# Patient Record
Sex: Female | Born: 1980 | Race: White | Hispanic: No | Marital: Married | Smoking: Never smoker
Health system: Southern US, Community
[De-identification: ages and names within clinical notes are randomized; demographics above are authoritative.]

## PROBLEM LIST (undated history)

## (undated) DIAGNOSIS — G40909 Epilepsy, unspecified, not intractable, without status epilepticus: Secondary | ICD-10-CM

## (undated) DIAGNOSIS — U071 COVID-19: Secondary | ICD-10-CM

## (undated) HISTORY — PX: SHOULDER ARTHROSCOPY: SHX128

---

## 2017-04-03 ENCOUNTER — Emergency Department
Admission: EM | Admit: 2017-04-03 | Discharge: 2017-04-03 | Disposition: A | Discharge status: Home / Self Care | Payer: BLUE CROSS/BLUE SHIELD | Source: Home / Self Care | Attending: Family Medicine | Admitting: Family Medicine

## 2017-04-03 DIAGNOSIS — R69 Illness, unspecified: Secondary | ICD-10-CM

## 2017-04-03 DIAGNOSIS — J111 Influenza due to unidentified influenza virus with other respiratory manifestations: Secondary | ICD-10-CM

## 2017-04-03 MED ORDER — OSELTAMIVIR PHOSPHATE 75 MG PO CAPS: 75.0000 mg | ORAL_CAPSULE | Freq: Two times a day (BID) | ORAL | 0 refills | Status: DC

## 2017-04-03 NOTE — Discharge Instructions (Signed)
Take plain guaifenesin (1200mg  extended release tabs such as Mucinex) twice daily, with plenty of water, for cough and congestion.  May add Pseudoephedrine (30mg , one or two every 4 to 6 hours) for sinus congestion.  Get adequate rest.   May use Afrin nasal spray (or generic oxymetazoline) each morning for about 5 days and then discontinue.  Also recommend using saline nasal spray several times daily and saline nasal irrigation (AYR is a common brand).  Use Flonase nasal spray each morning after using Afrin nasal spray and saline nasal irrigation. Try warm salt water gargles for sore throat.  May take Delsym Cough Suppressant at bedtime for nighttime cough.  Stop all antihistamines for now, and other non-prescription cough/cold preparations. May take Ibuprofen 200mg , 4 tabs every 8 hours with food for body aches, fever, etc.

## 2017-04-03 NOTE — ED Provider Notes (Signed)
[facility]    CSN: [id number] Arrival date & time: 04/03/17  1703     History   Chief Complaint Chief Complaint  Patient presents with  . Cough    HPI Primus Bravoikki Sweet is a 37 y.o. female.   Complains of 2 day history flu-like illness including myalgias, headache, chills/sweats, fatigue, and cough.  Also has mild nasal congestion and sore throat.  Cough is non-productive and somewhat worse at night.  No pleuritic pain or shortness of breath.      The history is provided by the patient.    No past medical history on file.  There are no active problems to display for this patient.     OB History    No data available       Home Medications    Prior to Admission medications   Medication Sig Start Date End Date Taking? Authorizing Provider  divalproex (DEPAKOTE) 500 MG DR tablet Take 1,000 mg by mouth daily.   Yes [provider]  oseltamivir (TAMIFLU) 75 MG capsule Take 1 capsule (75 mg total) by mouth every 12 (twelve) hours. 04/03/17   Lattie HawBeese, Zyonna Vardaman A, MD    Family History No family history on file.  Social History Social History   Tobacco Use  . Smoking status: Not on file  Substance Use Topics  . Alcohol use: Not on file  . Drug use: Not on file     Allergies   Septra [sulfamethoxazole-trimethoprim]   Review of Systems Review of Systems + sore throat + cough No pleuritic pain No wheezing + nasal congestion + post-nasal drainage + sinus pain/pressure No itchy/red eyes No earache No hemoptysis No SOB No fever, + chills/sweats No nausea No vomiting No abdominal pain No diarrhea No urinary symptoms No skin rash + fatigue + myalgias + headache Used OTC meds without relief   Physical Exam Triage Vital Signs ED Triage Vitals  Enc Vitals Group     BP 04/03/17 1724 131/84     Pulse Rate 04/03/17 1724 84     Resp 04/03/17 1724 16     Temp 04/03/17 1724 98.1 F (36.7 C)     Temp Source 04/03/17 1724 Oral     SpO2  04/03/17 1724 98 %     Weight 04/03/17 1725 129 lb 8 oz (58.7 kg)     Height 04/03/17 1725 5\' 1"  (1.549 m)     Head Circumference --      Peak Flow --      Pain Score 04/03/17 1725 2     Pain Loc --      Pain Edu? --      Excl. in GC? --    No data found.  Updated Vital Signs BP 131/84 (BP Location: Right Arm)   Pulse 84   Temp 98.1 F (36.7 C) (Oral)   Resp 16   Ht 5\' 1"  (1.549 m)   Wt 129 lb 8 oz (58.7 kg)   SpO2 98%   BMI 24.47 kg/m   Visual Acuity Right Eye Distance:   Left Eye Distance:   Bilateral Distance:    Right Eye Near:   Left Eye Near:    Bilateral Near:     Physical Exam Nursing notes and Vital Signs reviewed. Appearance:  Patient appears stated age, and in no acute distress Eyes:  Pupils are equal, round, and reactive to light and accomodation.  Extraocular movement is intact.  Conjunctivae are not inflamed  Ears:  Canals normal.  Tympanic membranes normal.  Nose:  Mildly congested turbinates.  No sinus tenderness.  Pharynx:  Normal Neck:  Supple.  Enlarged posterior/lateral nodes are palpated bilaterally, tender to palpation on the left.   Lungs:  Clear to auscultation.  Breath sounds are equal.  Moving air well. Heart:  Regular rate and rhythm without murmurs, rubs, or gallops.  Abdomen:  Nontender without masses or hepatosplenomegaly.  Bowel sounds are present.  No CVA or flank tenderness.  Extremities:  No edema.  Skin:  No rash present.    UC Treatments / Results  Labs (all labs ordered are listed, but only abnormal results are displayed) Labs Reviewed - No data to display  EKG  EKG Interpretation None       Radiology No results found.  Procedures Procedures (including critical care time)  Medications Ordered in UC Medications - No data to display   Initial Impression / Assessment and Plan / UC Course  I have reviewed the triage vital signs and the nursing notes.  Pertinent labs & imaging results that were available during my  care of the patient were reviewed by me and considered in my medical decision making (see chart for details).    Begin empiric Tamiflu. Take plain guaifenesin (1200mg  extended release tabs such as Mucinex) twice daily, with plenty of water, for cough and congestion.  May add Pseudoephedrine (30mg , one or two every 4 to 6 hours) for sinus congestion.  Get adequate rest.   May use Afrin nasal spray (or generic oxymetazoline) each morning for about 5 days and then discontinue.  Also recommend using saline nasal spray several times daily and saline nasal irrigation (AYR is a common brand).  Use Flonase nasal spray each morning after using Afrin nasal spray and saline nasal irrigation. Try warm salt water gargles for sore throat.  May take Delsym Cough Suppressant at bedtime for nighttime cough.  Stop all antihistamines for now, and other non-prescription cough/cold preparations. May take Ibuprofen 200mg , 4 tabs every 8 hours with food for body aches, fever, etc. Followup with Family Doctor if not improved in about 5 days. Will provide prophylactic Tamiflu Rx for two assymptomatic children at home.    Final Clinical Impressions(s) / UC Diagnoses   Final diagnoses:  Influenza-like illness    ED Discharge Orders        Ordered    oseltamivir (TAMIFLU) 75 MG capsule  Every 12 hours     04/03/17 1818          Lattie Haw, MD 04/07/17 1021

## 2017-04-03 NOTE — ED Triage Notes (Signed)
Pt. C/o chills, HA, sinus pain and cough for 2 days.

## 2018-01-11 DIAGNOSIS — G40909 Epilepsy, unspecified, not intractable, without status epilepticus: Secondary | ICD-10-CM | POA: Insufficient documentation

## 2018-10-17 ENCOUNTER — Emergency Department (INDEPENDENT_AMBULATORY_CARE_PROVIDER_SITE_OTHER)
Admission: EM | Admit: 2018-10-17 | Discharge: 2018-10-17 | Disposition: A | Discharge status: Home / Self Care | Payer: Managed Care, Other (non HMO) | Source: Home / Self Care | Attending: Family Medicine | Admitting: Family Medicine

## 2018-10-17 ENCOUNTER — Other Ambulatory Visit: Payer: Self-pay

## 2018-10-17 DIAGNOSIS — R519 Headache, unspecified: Secondary | ICD-10-CM

## 2018-10-17 DIAGNOSIS — R5383 Other fatigue: Secondary | ICD-10-CM

## 2018-10-17 DIAGNOSIS — R05 Cough: Secondary | ICD-10-CM

## 2018-10-17 DIAGNOSIS — R058 Other specified cough: Secondary | ICD-10-CM

## 2018-10-17 DIAGNOSIS — Z20818 Contact with and (suspected) exposure to other bacterial communicable diseases: Secondary | ICD-10-CM

## 2018-10-17 DIAGNOSIS — J029 Acute pharyngitis, unspecified: Secondary | ICD-10-CM | POA: Diagnosis not present

## 2018-10-17 HISTORY — DX: Epilepsy, unspecified, not intractable, without status epilepticus: G40.909

## 2018-10-17 LAB — POCT RAPID STREP A (OFFICE): Rapid Strep A Screen: NEGATIVE

## 2018-10-17 NOTE — ED Triage Notes (Addendum)
Sore throat since Sunday.  Both son and daughter have been dx with strep this week.

## 2018-10-17 NOTE — Discharge Instructions (Signed)
  You may take 500mg acetaminophen every 4-6 hours or in combination with ibuprofen 400-600mg every 6-8 hours as needed for pain, inflammation, and fever.  Be sure to well hydrated with clear liquids and get at least 8 hours of sleep at night, preferably more while sick.   Please follow up with family medicine in 1 week if needed.   

## 2018-10-17 NOTE — ED Provider Notes (Signed)
[facility]    CSN: [id number] Arrival date & time: 10/17/18  0848     History   Chief Complaint Chief Complaint  Patient presents with  . Sore Throat    HPI Lori Barry is a 38 y.o. female.   HPI Lori Barry is a 38 y.o. female presenting to UC with c/o 2 days of worsening sore throat, generalized HA, mild dry cough, and fatigue.  She has not taken anything for her symptoms. She reports hx of epilepsy and states she is hesitant to take any medications.  She does note her son and daughter were both dx with strep throat yesterday.  They have been attending a day camp. No known sick contacts. No known cases of Covid-19 at the camp. Pt works from home.    Past Medical History:  Diagnosis Date  . Epilepsy ([facility])     There are no active problems to display for this patient.   Past Surgical History:  Procedure Laterality Date  . SHOULDER ARTHROSCOPY      OB History   No obstetric history on file.      Home Medications    Prior to Admission medications   Medication Sig Start Date End Date Taking? Authorizing Provider  divalproex (DEPAKOTE) 500 MG DR tablet Take 1,000 mg by mouth daily.    [provider]  oseltamivir (TAMIFLU) 75 MG capsule Take 1 capsule (75 mg total) by mouth every 12 (twelve) hours. 04/03/17   Lattie HawBeese, Stephen A, MD    Family History History reviewed. No pertinent family history.  Social History Social History   Tobacco Use  . Smoking status: Never Smoker  . Smokeless tobacco: Never Used  Substance Use Topics  . Alcohol use: Not Currently  . Drug use: Not Currently     Allergies   Septra [sulfamethoxazole-trimethoprim]   Review of Systems Review of Systems  Constitutional: Positive for fatigue. Negative for chills and fever.  HENT: Positive for sore throat. Negative for congestion, ear pain, trouble swallowing and voice change.   Respiratory: Positive for cough. Negative for shortness of breath.   Cardiovascular:  Negative for chest pain and palpitations.  Gastrointestinal: Negative for abdominal pain, diarrhea, nausea and vomiting.  Musculoskeletal: Negative for arthralgias, back pain and myalgias.  Skin: Negative for rash.  Neurological: Positive for headaches. Negative for dizziness and light-headedness.     Physical Exam Triage Vital Signs ED Triage Vitals  Enc Vitals Group     BP 10/17/18 0908 116/84     Pulse Rate 10/17/18 0908 95     Resp 10/17/18 0908 20     Temp 10/17/18 0908 98.4 F (36.9 C)     Temp Source 10/17/18 0908 Oral     SpO2 10/17/18 0908 98 %     Weight 10/17/18 0910 145 lb (65.8 kg)     Height 10/17/18 0910 5\' 1"  (1.549 m)     Head Circumference --      Peak Flow --      Pain Score 10/17/18 0910 5     Pain Loc --      Pain Edu? --      Excl. in GC? --    No data found.  Updated Vital Signs BP 116/84 (BP Location: Right Arm)   Pulse 95   Temp 98.4 F (36.9 C) (Oral)   Resp 20   Ht 5\' 1"  (1.549 m)   Wt 145 lb (65.8 kg)   LMP 10/11/2018   SpO2 98%  BMI 27.40 kg/m   Visual Acuity Right Eye Distance:   Left Eye Distance:   Bilateral Distance:    Right Eye Near:   Left Eye Near:    Bilateral Near:     Physical Exam Vitals signs and nursing note reviewed.  Constitutional:      Appearance: She is well-developed.  HENT:     Head: Normocephalic and atraumatic.     Right Ear: Tympanic membrane normal.     Left Ear: Tympanic membrane normal.     Nose: Nose normal.     Right Sinus: No maxillary sinus tenderness or frontal sinus tenderness.     Left Sinus: No maxillary sinus tenderness or frontal sinus tenderness.     Mouth/Throat:     Lips: Pink.     Mouth: Mucous membranes are moist.     Pharynx: Oropharynx is clear. Uvula midline.  Neck:     Musculoskeletal: Normal range of motion and neck supple.  Cardiovascular:     Rate and Rhythm: Normal rate and regular rhythm.  Pulmonary:     Effort: Pulmonary effort is normal. No respiratory distress.      Breath sounds: Normal breath sounds. No stridor. No wheezing, rhonchi or rales.  Musculoskeletal: Normal range of motion.  Lymphadenopathy:     Cervical: No cervical adenopathy.  Skin:    General: Skin is warm and dry.  Neurological:     Mental Status: She is alert and oriented to person, place, and time.  Psychiatric:        Behavior: Behavior normal.      UC Treatments / Results  Labs (all labs ordered are listed, but only abnormal results are displayed) Labs Reviewed  STREP A DNA PROBE  NOVEL CORONAVIRUS, NAA  POCT RAPID STREP A (OFFICE)    EKG   Radiology No results found.  Procedures Procedures (including critical care time)  Medications Ordered in UC Medications - No data to display  Initial Impression / Assessment and Plan / UC Course  I have reviewed the triage vital signs and the nursing notes.  Pertinent labs & imaging results that were available during my care of the patient were reviewed by me and considered in my medical decision making (see chart for details).    Rapid strep: Negative Culture sent, will hold off on antibiotics given normal throat exam but culture sent due to known exposure to strep.  Discussed Covid-19 testing given pt's multiple symptoms. Pt agreeable. AVS provided.  Final Clinical Impressions(s) / UC Diagnoses   Final diagnoses:  Sore throat  Generalized headache  Fatigue, unspecified type  Dry cough  Exposure to strep throat     Discharge Instructions      You may take 500mg  acetaminophen every 4-6 hours or in combination with ibuprofen 400-600mg  every 6-8 hours as needed for pain, inflammation, and fever.  Be sure to well hydrated with clear liquids and get at least 8 hours of sleep at night, preferably more while sick.   Please follow up with family medicine in 1 week if needed.     ED Prescriptions    None     Controlled Substance Prescriptions [place] Controlled Substance Registry consulted? Not Applicable    Tyrell Antonio 10/17/18 1126

## 2018-10-18 ENCOUNTER — Telehealth: Payer: Self-pay

## 2018-10-18 LAB — STREP A DNA PROBE: Group A Strep Probe: NOT DETECTED

## 2018-10-18 MED ORDER — AMOXICILLIN 875 MG PO TABS: 875.0000 mg | ORAL_TABLET | Freq: Two times a day (BID) | ORAL | 0 refills | Status: DC

## 2018-10-18 NOTE — Telephone Encounter (Signed)
Patient has developed nasal congestion and earache.  Begin amoxicillin 875mg  BID for one week.  Schedule follow-up appointment with Dr. Charise Killian in one week.

## 2018-10-18 NOTE — Telephone Encounter (Signed)
Left message on VM with neg strep results.  COVID still pending.  Contact information given for any questions or concerns.

## 2018-10-19 LAB — NOVEL CORONAVIRUS, NAA: SARS-CoV-2, NAA: NOT DETECTED

## 2018-10-20 ENCOUNTER — Telehealth: Payer: Self-pay | Admitting: *Deleted

## 2018-10-20 NOTE — Telephone Encounter (Signed)
Spoke to pt given COVID results.

## 2019-09-14 ENCOUNTER — Emergency Department (INDEPENDENT_AMBULATORY_CARE_PROVIDER_SITE_OTHER)
Admission: RE | Admit: 2019-09-14 | Discharge: 2019-09-14 | Disposition: A | Discharge status: Home / Self Care | Payer: Managed Care, Other (non HMO) | Source: Ambulatory Visit | Attending: Family Medicine | Admitting: Family Medicine

## 2019-09-14 ENCOUNTER — Emergency Department (INDEPENDENT_AMBULATORY_CARE_PROVIDER_SITE_OTHER): Payer: Managed Care, Other (non HMO)

## 2019-09-14 ENCOUNTER — Other Ambulatory Visit: Payer: Self-pay

## 2019-09-14 VITALS — BP 117/81 | HR 82 | Temp 99.0°F | Ht 61.0 in | Wt 124.0 lb

## 2019-09-14 DIAGNOSIS — J069 Acute upper respiratory infection, unspecified: Secondary | ICD-10-CM

## 2019-09-14 DIAGNOSIS — R0602 Shortness of breath: Secondary | ICD-10-CM | POA: Diagnosis not present

## 2019-09-14 DIAGNOSIS — R5383 Other fatigue: Secondary | ICD-10-CM | POA: Diagnosis not present

## 2019-09-14 DIAGNOSIS — R05 Cough: Secondary | ICD-10-CM | POA: Diagnosis not present

## 2019-09-14 DIAGNOSIS — J9801 Acute bronchospasm: Secondary | ICD-10-CM

## 2019-09-14 DIAGNOSIS — Z8616 Personal history of COVID-19: Secondary | ICD-10-CM | POA: Diagnosis not present

## 2019-09-14 MED ORDER — ALBUTEROL SULFATE HFA 108 (90 BASE) MCG/ACT IN AERS: 2.0000 | INHALATION_SPRAY | RESPIRATORY_TRACT | 0 refills | Status: DC | PRN

## 2019-09-14 MED ORDER — AZITHROMYCIN 250 MG PO TABS: ORAL_TABLET | ORAL | 0 refills | Status: DC

## 2019-09-14 MED ORDER — PREDNISONE 20 MG PO TABS: ORAL_TABLET | ORAL | 0 refills | Status: DC

## 2019-09-14 NOTE — ED Provider Notes (Signed)
[facility]    CSN: [id number] Arrival date & time: 09/14/19  0626      History   Chief Complaint Chief Complaint  Patient presents with  . Cough    HPI Lori Barry is a 39 y.o. female.   Patient complains of five day history of typical cold-like symptoms developing over several days, including mild sore throat, sinus congestion, headache, fatigue, myalgias, fever, and cough.  She had COVID19 infection in March; recent COVID19 test negative. She has been previously prescribed an albuterol inhaler for chest congestion.  She has a family history of asthma.   The history is provided by the patient.    Past Medical History:  Diagnosis Date  . Epilepsy ([place])     There are no problems to display for this patient.   Past Surgical History:  Procedure Laterality Date  . SHOULDER ARTHROSCOPY      OB History   No obstetric history on file.      Home Medications    Prior to Admission medications   Medication Sig Start Date End Date Taking? Authorizing Provider  albuterol (VENTOLIN HFA) 108 (90 Base) MCG/ACT inhaler Inhale 2 puffs into the lungs every 4 (four) hours as needed for wheezing or shortness of breath. 09/14/19   Kandra Nicolas, MD  azithromycin (ZITHROMAX Z-PAK) 250 MG tablet Take 2 tabs today; then begin one tab once daily for 4 more days. 09/14/19   Kandra Nicolas, MD  predniSONE (DELTASONE) 20 MG tablet Take one tab by mouth twice daily for 4 days, then one daily for 3 days. Take with food. 09/14/19   Kandra Nicolas, MD    Family History Family History  Problem Relation Age of Onset  . Asthma Mother   . Hypertension Father     Social History Social History   Tobacco Use  . Smoking status: Never Smoker  . Smokeless tobacco: Never Used  Vaping Use  . Vaping Use: Never used  Substance Use Topics  . Alcohol use: Not Currently  . Drug use: Not Currently     Allergies   Septra [sulfamethoxazole-trimethoprim]   Review of  Systems Review of Systems + sore throat + cough No pleuritic pain but feels heavy in chest ? wheezing + nasal congestion + post-nasal drainage No sinus pain/pressure No itchy/red eyes No earache No hemoptysis + SOB + fever, + chills No nausea No vomiting No abdominal pain No diarrhea No urinary symptoms No skin rash + fatigue + myalgias + headache Used OTC meds (Mucinex) without relief   Physical Exam Triage Vital Signs ED Triage Vitals  Enc Vitals Group     BP 09/14/19 1013 117/81     Pulse Rate 09/14/19 1013 82     Resp --      Temp 09/14/19 1013 99 F (37.2 C)     Temp src --      SpO2 09/14/19 1013 100 %     Weight 09/14/19 1014 124 lb (56.2 kg)     Height 09/14/19 1014 5\' 1"  (1.549 m)     Head Circumference --      Peak Flow --      Pain Score 09/14/19 1014 3     Pain Loc --      Pain Edu? --      Excl. in [place]? --    No data found.  Updated Vital Signs BP 117/81 (BP Location: Right Arm)   Pulse 82   Temp 99 F (37.2  C)   Ht 5\' 1"  (1.549 m)   Wt 56.2 kg   SpO2 100%   BMI 23.43 kg/m   Visual Acuity Right Eye Distance:   Left Eye Distance:   Bilateral Distance:    Right Eye Near:   Left Eye Near:    Bilateral Near:     Physical Exam Nursing notes and Vital Signs reviewed. Appearance:  Patient appears stated age, and in no acute distress Eyes:  Pupils are equal, round, and reactive to light and accomodation.  Extraocular movement is intact.  Conjunctivae are not inflamed  Ears:  Canals normal.  Tympanic membranes normal.  Nose:  Mildly congested turbinates.  No sinus tenderness.    Pharynx:  Normal Neck:  Supple.  Mildly tender enlarged lateral nodes are present  Lungs:  Clear to auscultation.  Breath sounds are equal.  Moving air well. Heart:  Regular rate and rhythm without murmurs, rubs, or gallops.  Abdomen:  Nontender without masses or hepatosplenomegaly.  Bowel sounds are present.  No CVA or flank tenderness.  Extremities:  No edema.   Skin:  No rash present.   UC Treatments / Results  Labs (all labs ordered are listed, but only abnormal results are displayed) Labs Reviewed - No data to display  EKG   Radiology DG Chest 2 View  Result Date: 09/14/2019 CLINICAL DATA:  Cough, sob since Monday, fatigue. Hx of covid in march. EXAM: CHEST - 2 VIEW COMPARISON:  None. FINDINGS: The heart size and mediastinal contours are within normal limits. The lungs are clear. No pneumothorax or pleural effusion. The visualized skeletal structures are unremarkable. IMPRESSION: No acute cardiopulmonary disease. Electronically Signed   By: April M.D.   On: 09/14/2019 11:57    Procedures Procedures (including critical care time)  Medications Ordered in UC Medications - No data to display  Initial Impression / Assessment and Plan / UC Course  I have reviewed the triage vital signs and the nursing notes.  Pertinent labs & imaging results that were available during my care of the patient were reviewed by me and considered in my medical decision making (see chart for details).    Normal chest x-ray reassuring.  There is no evidence of bacterial infection today.   Begin prednisone burst/taper.  Refill albuterol inhaler. Followup with Family Doctor if not improved in about 10 days.    Final Clinical Impressions(s) / UC Diagnoses   Final diagnoses:  Viral URI with cough  Bronchospasm     Discharge Instructions     Take plain guaifenesin (1200mg  extended release tabs such as Mucinex) twice daily, with plenty of water, for cough and congestion. Get adequate rest.   May take Delsym Cough Suppressant at bedtime for nighttime cough.  Try warm salt water gargles for sore throat.  Stop all antihistamines for now, and other non-prescription cough/cold preparations. May take Tylenol for headache, fever, etc. Begin Azithromycin if not improving about one week or if persistent fever develops (Given a prescription to hold, with  an expiration date)        ED Prescriptions    Medication Sig Dispense Auth. Provider   predniSONE (DELTASONE) 20 MG tablet Take one tab by mouth twice daily for 4 days, then one daily for 3 days. Take with food. 11 tablet 09/16/2019, MD   albuterol (VENTOLIN HFA) 108 (90 Base) MCG/ACT inhaler Inhale 2 puffs into the lungs every 4 (four) hours as needed for wheezing or shortness of breath. 18 g Rillie Riffel,  Tera Mater, MD   azithromycin (ZITHROMAX Z-PAK) 250 MG tablet Take 2 tabs today; then begin one tab once daily for 4 more days. 6 tablet Lattie Haw, MD        Lattie Haw, MD 09/17/19 [phone]

## 2019-09-14 NOTE — ED Triage Notes (Signed)
Cough, heavy chest, hurts when she coughs,  had COVID in March, feels fatigue x 5 days

## 2019-09-14 NOTE — Discharge Instructions (Addendum)
Take plain guaifenesin (1200mg  extended release tabs such as Mucinex) twice daily, with plenty of water, for cough and congestion. Get adequate rest.   May take Delsym Cough Suppressant at bedtime for nighttime cough.  Try warm salt water gargles for sore throat.  Stop all antihistamines for now, and other non-prescription cough/cold preparations. May take Tylenol for headache, fever, etc. Begin Azithromycin if not improving about one week or if persistent fever develops

## 2019-09-14 NOTE — ED Notes (Signed)
Pt asked about delay in being seen since her appointment time was at 10am. Writer explained the appointment times hold her spot to be seen and there were patients ahead of her to be seen prior to her arrival. Patient verbalized an understanding, also iverbally nformed that she was the next patient in line by Clinical research associate.

## 2020-05-23 ENCOUNTER — Emergency Department (INDEPENDENT_AMBULATORY_CARE_PROVIDER_SITE_OTHER): Payer: Managed Care, Other (non HMO)

## 2020-05-23 ENCOUNTER — Emergency Department (INDEPENDENT_AMBULATORY_CARE_PROVIDER_SITE_OTHER)
Admission: RE | Admit: 2020-05-23 | Discharge: 2020-05-23 | Disposition: A | Discharge status: Home / Self Care | Payer: Managed Care, Other (non HMO) | Source: Ambulatory Visit

## 2020-05-23 ENCOUNTER — Other Ambulatory Visit: Payer: Self-pay

## 2020-05-23 VITALS — BP 110/80 | HR 83 | Temp 98.7°F | Resp 15 | Ht 61.0 in | Wt 130.0 lb

## 2020-05-23 DIAGNOSIS — R058 Other specified cough: Secondary | ICD-10-CM

## 2020-05-23 DIAGNOSIS — J069 Acute upper respiratory infection, unspecified: Secondary | ICD-10-CM

## 2020-05-23 DIAGNOSIS — R0600 Dyspnea, unspecified: Secondary | ICD-10-CM | POA: Diagnosis not present

## 2020-05-23 HISTORY — DX: COVID-19: U07.1

## 2020-05-23 MED ORDER — BENZONATATE 100 MG PO CAPS: 100.0000 mg | ORAL_CAPSULE | Freq: Three times a day (TID) | ORAL | 0 refills | Status: DC

## 2020-05-23 MED ORDER — ALBUTEROL SULFATE HFA 108 (90 BASE) MCG/ACT IN AERS: 2.0000 | INHALATION_SPRAY | RESPIRATORY_TRACT | 0 refills | Status: DC | PRN

## 2020-05-23 NOTE — Discharge Instructions (Addendum)
Can use 2 puffs of inhaler every 4-6 hours as needed  Continue use of mucinex for congestion  Can take Tessalon three times a day to help with cough  Follow up at Urgent care if pain with breathing persist or worsening, shortness of breath worsens, cough persist, new symptoms develop

## 2020-05-23 NOTE — ED Provider Notes (Signed)
[facility]    CSN: [id number] Arrival date & time: 05/23/20  1156      History   Chief Complaint Chief Complaint  Patient presents with  . Appointment  . Cough  . Back Pain    HPI Lori Barry is a 40 y.o. female.   Patient presents with productive cough with green and brown sputum, burning thoracic back pain when deep breathing, dyspnea on exertion, nasal congestion, rhinorrhea, diarrhea, chills and left ear pain beginning two days ago after starting in person work. Denies fever, body aches, abdominal pain, sore throat, sinus pressure, headaches. At home COVID test Tuesday and today negative. Vaccinated, no booster. Known sick contacts at work.   Past Medical History:  Diagnosis Date  . COVID-19   . Epilepsy [facility])     Patient Active Problem List   Diagnosis Date Noted  . Epilepsy (HCC) 01/11/2018    Past Surgical History:  Procedure Laterality Date  . SHOULDER ARTHROSCOPY      OB History   No obstetric history on file.      Home Medications    Prior to Admission medications   Medication Sig Start Date End Date Taking? Authorizing Provider  Levonorgestrel-Ethinyl Estradiol (AMETHIA) 0.15-0.03 &0.01 MG tablet Take 1 tablet by mouth daily. 01/15/20  Yes [provider]  promethazine (PHENERGAN) 25 MG suppository Place 1 suppository rectally every 6 hrs as needed for headache/nausea 04/22/20  Yes [provider]  Ubrogepant 100 MG TABS Take one at onset of severe headache; may repeat once in 2 hours if headache persists; max of 2 in 24 hours 05/07/20  Yes [provider]  zonisamide (ZONEGRAN) 100 MG capsule Take by mouth. 04/13/19  Yes [provider]  albuterol (VENTOLIN HFA) 108 (90 Base) MCG/ACT inhaler Inhale 2 puffs into the lungs every 4 (four) hours as needed for wheezing or shortness of breath. 09/14/19   Lattie Haw, MD  azithromycin (ZITHROMAX Z-PAK) 250 MG tablet Take 2 tabs today; then begin one tab once  daily for 4 more days. Patient not taking: Reported on 05/23/2020 09/14/19   Lattie Haw, MD  predniSONE (DELTASONE) 20 MG tablet Take one tab by mouth twice daily for 4 days, then one daily for 3 days. Take with food. Patient not taking: Reported on 05/23/2020 09/14/19   Lattie Haw, MD    Family History Family History  Problem Relation Age of Onset  . Asthma Mother   . Hypertension Father     Social History Social History   Tobacco Use  . Smoking status: Never Smoker  . Smokeless tobacco: Never Used  Vaping Use  . Vaping Use: Never used  Substance Use Topics  . Alcohol use: Not Currently  . Drug use: Not Currently     Allergies   Septra [sulfamethoxazole-trimethoprim]   Review of Systems Review of Systems  Constitutional: Positive for appetite change and chills. Negative for activity change, diaphoresis, fatigue, fever and unexpected weight change.  HENT: Positive for congestion, ear pain and rhinorrhea. Negative for dental problem, drooling, ear discharge, facial swelling, hearing loss, mouth sores, nosebleeds, postnasal drip, sinus pressure, sinus pain, sneezing, sore throat, tinnitus, trouble swallowing and voice change.   Eyes: Negative.   Respiratory: Positive for cough and shortness of breath. Negative for apnea, choking, chest tightness, wheezing and stridor.   Cardiovascular: Negative.   Gastrointestinal: Positive for diarrhea. Negative for abdominal distention, abdominal pain, anal bleeding, blood in stool, constipation, nausea, rectal pain and vomiting.  Genitourinary: Negative.   Musculoskeletal: Positive for back pain. Negative for arthralgias, gait problem, joint swelling, myalgias, neck pain and neck stiffness.  Skin: Negative.   Allergic/Immunologic: Positive for environmental allergies. Negative for food allergies and immunocompromised state.  Neurological: Negative.      Physical Exam Triage Vital Signs ED Triage Vitals  Enc Vitals Group      BP 05/23/20 1210 110/80     Pulse Rate 05/23/20 1210 83     Resp 05/23/20 1210 15     Temp 05/23/20 1210 98.7 F (37.1 C)     Temp Source 05/23/20 1210 Oral     SpO2 05/23/20 1210 98 %     Weight 05/23/20 1214 130 lb (59 kg)     Height 05/23/20 1214 5\' 1"  (1.549 m)     Head Circumference --      Peak Flow --      Pain Score 05/23/20 1214 0     Pain Loc --      Pain Edu? --      Excl. in GC? --    No data found.  Updated Vital Signs BP 110/80 (BP Location: Right Arm)   Pulse 83   Temp 98.7 F (37.1 C) (Oral)   Resp 15   Ht 5\' 1"  (1.549 m)   Wt 130 lb (59 kg)   SpO2 98%   BMI 24.56 kg/m   Visual Acuity Right Eye Distance:   Left Eye Distance:   Bilateral Distance:    Right Eye Near:   Left Eye Near:    Bilateral Near:     Physical Exam Constitutional:      Appearance: Normal appearance. She is normal weight.  HENT:     Head: Normocephalic.     Right Ear: Tympanic membrane, ear canal and external ear normal.     Left Ear: Tympanic membrane, ear canal and external ear normal.     Nose: Congestion and rhinorrhea present.     Mouth/Throat:     Mouth: Mucous membranes are moist.     Pharynx: Oropharynx is clear. No oropharyngeal exudate or posterior oropharyngeal erythema.  Eyes:     Extraocular Movements: Extraocular movements intact.     Conjunctiva/sclera: Conjunctivae normal.     Pupils: Pupils are equal, round, and reactive to light.  Cardiovascular:     Rate and Rhythm: Normal rate and regular rhythm.     Pulses: Normal pulses.     Heart sounds: Normal heart sounds.  Pulmonary:     Effort: Pulmonary effort is normal.     Comments: Rhonchi heard over bilateral lower lobes after forceful coughing expiratory wheezing heard over bilateral upper lobes  Abdominal:     General: Abdomen is flat. Bowel sounds are normal.     Palpations: Abdomen is soft.  Musculoskeletal:     Cervical back: Normal range of motion and neck supple.  Skin:    General: Skin is warm  and dry.  Neurological:     Mental Status: She is alert and oriented to person, place, and time. Mental status is at baseline.  Psychiatric:        Mood and Affect: Mood normal.        Behavior: Behavior normal.        Thought Content: Thought content normal.        Judgment: Judgment normal.      UC Treatments / Results  Labs (all labs ordered are listed, but only abnormal results are displayed) Labs Reviewed -  No data to display  EKG   Radiology No results found.  Procedures Procedures (including critical care time)  Medications Ordered in UC Medications - No data to display  Initial Impression / Assessment and Plan / UC Course  I have reviewed the triage vital signs and the nursing notes.  Pertinent labs & imaging results that were available during my care of the patient were reviewed by me and considered in my medical decision making (see chart for details).     Viral URI with cough   1. Chest x-ray-negative 2. Albuterol inhaler 2 puffs every 4-6 hours as needed 3. Tessalon 100 mg tid 4. Continue use of otc mucinex for congestion Final Clinical Impressions(s) / UC Diagnoses   Final diagnoses:  None   Discharge Instructions   None    ED Prescriptions    None     PDMP not reviewed this encounter.   Valinda Hoar, NP 05/23/20 1331

## 2020-05-23 NOTE — ED Triage Notes (Signed)
Cough & upper back pain since Tuesday  Nasal congestion  Chills - did not check temp  OTC mucinex & advil COVID 05/2019 COVID vaccine 06/2019 & flu vaccine caused pt to pass out after the 2nd vaccine & flu vaccine  No booster at this time

## 2020-05-26 ENCOUNTER — Other Ambulatory Visit: Payer: Self-pay

## 2020-05-26 ENCOUNTER — Encounter: Payer: Self-pay | Admitting: Emergency Medicine

## 2020-05-26 ENCOUNTER — Emergency Department (INDEPENDENT_AMBULATORY_CARE_PROVIDER_SITE_OTHER)
Admission: EM | Admit: 2020-05-26 | Discharge: 2020-05-26 | Disposition: A | Discharge status: Home / Self Care | Payer: Managed Care, Other (non HMO) | Source: Home / Self Care

## 2020-05-26 ENCOUNTER — Emergency Department
Admit: 2020-05-26 | Discharge status: Absent without leave | Payer: Managed Care, Other (non HMO) | Source: Home / Self Care

## 2020-05-26 DIAGNOSIS — R5383 Other fatigue: Secondary | ICD-10-CM | POA: Diagnosis not present

## 2020-05-26 DIAGNOSIS — J069 Acute upper respiratory infection, unspecified: Secondary | ICD-10-CM | POA: Diagnosis not present

## 2020-05-26 DIAGNOSIS — R0602 Shortness of breath: Secondary | ICD-10-CM

## 2020-05-26 MED ORDER — PREDNISONE 10 MG (21) PO TBPK: ORAL_TABLET | Freq: Every day | ORAL | 0 refills | Status: AC

## 2020-05-26 NOTE — ED Triage Notes (Signed)
Recheck lungs,not feeling better since Friday. Waster told to return if not feeling better. Vaccinated.Had Covid one year ago.

## 2020-05-26 NOTE — Discharge Instructions (Addendum)
I have sent in a prednisone taper for you to take for 6 days. 6 tablets on day one, 5 tablets on day two, 4 tablets on day three, 3 tablets on day four, 2 tablets on day five, and 1 tablet on day six.  Push fluids. May use electrolyte additives as well. (Liquid IV, gatorade, etc)  Your COVID test is pending.  You should self quarantine until the test result is back.    Take Tylenol or ibuprofen as needed for fever or discomfort.  Rest and keep yourself hydrated.    Follow-up with your primary care provider if your symptoms are not improving.

## 2020-05-26 NOTE — ED Provider Notes (Signed)
[facility]    CSN: [id number] Arrival date & time: 05/26/20  9381      History   Chief Complaint No chief complaint on file.   HPI Lori Barry is a 40 y.o. female.   Reports that she has been sick for the last week. Reports SOB, cough, fatigue, chills, body aches, decreased appetite. Was seen in this office x 3 days ago with negative chest xray. Was prescribed inhaler that has helped the wheezing and cough some. Reports that she still has a productive cough and that it is getting lighter in color. Reports negative home Covid test x 3 days ago and yesterday. Denies fever, nausea, vomiting, diarrhea, rash, other symptoms.   ROS per HPI  The history is provided by the patient.    Past Medical History:  Diagnosis Date  . COVID-19   . Epilepsy [facility])     Patient Active Problem List   Diagnosis Date Noted  . Epilepsy (HCC) 01/11/2018    Past Surgical History:  Procedure Laterality Date  . SHOULDER ARTHROSCOPY      OB History   No obstetric history on file.      Home Medications    Prior to Admission medications   Medication Sig Start Date End Date Taking? Authorizing Provider  buPROPion (WELLBUTRIN) 100 MG tablet Take 100 mg by mouth 2 (two) times daily.   Yes [provider]  predniSONE (STERAPRED UNI-PAK 21 TAB) 10 MG (21) TBPK tablet Take by mouth daily for 6 days. Take 6 tablets on day 1, 5 tablets on day 2, 4 tablets on day 3, 3 tablets on day 4, 2 tablets on day 5, 1 tablet on day 6 05/26/20 06/01/20 Yes Moshe Cipro, NP  albuterol (VENTOLIN HFA) 108 (90 Base) MCG/ACT inhaler Inhale 2 puffs into the lungs every 4 (four) hours as needed for wheezing or shortness of breath. 05/23/20   White, Elita Boone, NP  benzonatate (TESSALON) 100 MG capsule Take 1 capsule (100 mg total) by mouth every 8 (eight) hours. 05/23/20   Valinda Hoar, NP  Levonorgestrel-Ethinyl Estradiol (AMETHIA) 0.15-0.03 &0.01 MG tablet Take 1 tablet by mouth daily.  01/15/20   [provider]  promethazine (PHENERGAN) 25 MG suppository Place 1 suppository rectally every 6 hrs as needed for headache/nausea 04/22/20   [provider]  Ubrogepant 100 MG TABS Take one at onset of severe headache; may repeat once in 2 hours if headache persists; max of 2 in 24 hours 05/07/20   [provider]  zonisamide (ZONEGRAN) 100 MG capsule Take by mouth. 04/13/19   [provider]    Family History Family History  Problem Relation Age of Onset  . Asthma Mother   . Hypertension Father     Social History Social History   Tobacco Use  . Smoking status: Never Smoker  . Smokeless tobacco: Never Used  Vaping Use  . Vaping Use: Never used  Substance Use Topics  . Alcohol use: Yes  . Drug use: Not Currently     Allergies   Septra [sulfamethoxazole-trimethoprim]   Review of Systems Review of Systems   Physical Exam Triage Vital Signs ED Triage Vitals  Enc Vitals Group     BP 05/26/20 0937 127/84     Pulse Rate 05/26/20 0937 78     Resp 05/26/20 0937 18     Temp 05/26/20 0937 98.6 F (37 C)     Temp Source 05/26/20 0937 Oral     SpO2 05/26/20  0937 97 %     Weight 05/26/20 0938 130 lb (59 kg)     Height 05/26/20 0938 5\' 1"  (1.549 m)     Head Circumference --      Peak Flow --      Pain Score 05/26/20 0938 0     Pain Loc --      Pain Edu? --      Excl. in GC? --    No data found.  Updated Vital Signs BP 127/84 (BP Location: Right Arm)   Pulse 78   Temp 98.6 F (37 C) (Oral)   Resp 18   Ht 5\' 1"  (1.549 m)   Wt 130 lb (59 kg)   SpO2 97%   BMI 24.56 kg/m      Physical Exam Vitals and nursing note reviewed.  Constitutional:      General: She is not in acute distress.    Appearance: She is well-developed and well-nourished. She is ill-appearing.  HENT:     Head: Normocephalic and atraumatic.     Right Ear: Tympanic membrane, ear canal and external ear normal.     Left Ear: Tympanic membrane, ear canal  and external ear normal.     Nose: Congestion present.     Mouth/Throat:     Mouth: Mucous membranes are moist.     Pharynx: Oropharynx is clear.  Eyes:     Extraocular Movements: Extraocular movements intact.     Conjunctiva/sclera: Conjunctivae normal.     Pupils: Pupils are equal, round, and reactive to light.  Cardiovascular:     Rate and Rhythm: Normal rate and regular rhythm.     Heart sounds: Normal heart sounds. No murmur heard.   Pulmonary:     Effort: Pulmonary effort is normal. No respiratory distress.     Breath sounds: Normal breath sounds. No stridor. No wheezing, rhonchi or rales.  Chest:     Chest wall: No tenderness.  Abdominal:     General: There is no distension.     Palpations: Abdomen is soft. There is no mass.     Tenderness: There is no abdominal tenderness. There is no right CVA tenderness, left CVA tenderness, guarding or rebound.     Hernia: No hernia is present.  Musculoskeletal:        General: No edema. Normal range of motion.     Cervical back: Normal range of motion and neck supple.  Skin:    General: Skin is warm and dry.     Capillary Refill: Capillary refill takes less than 2 seconds.  Neurological:     General: No focal deficit present.     Mental Status: She is alert and oriented to person, place, and time.  Psychiatric:        Mood and Affect: Mood and affect and mood normal.        Behavior: Behavior normal.        Thought Content: Thought content normal.      UC Treatments / Results  Labs (all labs ordered are listed, but only abnormal results are displayed) Labs Reviewed  COVID-19, FLU A+B NAA    EKG   Radiology No results found.  Procedures Procedures (including critical care time)  Medications Ordered in UC Medications - No data to display  Initial Impression / Assessment and Plan / UC Course  I have reviewed the triage vital signs and the nursing notes.  Pertinent labs & imaging results that were available  during my care of  the patient were reviewed by me and considered in my medical decision making (see chart for details).    Viral URI Fatigue SOB  Prescribed steroid taper Continue inhaler as needed Covid swab obtained in office today.   Patient instructed to quarantine until results are back and negative.   If results are negative, patient may resume daily schedule as tolerated once they are fever free for 24 hours without the use of antipyretic medications.   If results are positive, patient instructed to quarantine for at least 5 days from symptom onset.  If after 5 days symptoms have resolved, may return to work with a well fitting mask for the next 5 days. If symptomatic after day 5, isolation should be extended to 10 days. Patient instructed to follow-up with primary care or with this office as needed.   Patient instructed to follow-up in the ER for trouble swallowing, trouble breathing, other concerning symptoms.   Final Clinical Impressions(s) / UC Diagnoses   Final diagnoses:  Viral URI with cough  Other fatigue  SOB (shortness of breath)     Discharge Instructions     I have sent in a prednisone taper for you to take for 6 days. 6 tablets on day one, 5 tablets on day two, 4 tablets on day three, 3 tablets on day four, 2 tablets on day five, and 1 tablet on day six.  Push fluids. May use electrolyte additives as well. (Liquid IV, gatorade, etc)  Your COVID test is pending.  You should self quarantine until the test result is back.    Take Tylenol or ibuprofen as needed for fever or discomfort.  Rest and keep yourself hydrated.    Follow-up with your primary care provider if your symptoms are not improving.        ED Prescriptions    Medication Sig Dispense Auth. Provider   predniSONE (STERAPRED UNI-PAK 21 TAB) 10 MG (21) TBPK tablet Take by mouth daily for 6 days. Take 6 tablets on day 1, 5 tablets on day 2, 4 tablets on day 3, 3 tablets on day 4, 2 tablets on  day 5, 1 tablet on day 6 21 tablet Moshe Cipro, NP     PDMP not reviewed this encounter.   Moshe Cipro, NP 05/26/20 1002

## 2020-05-28 LAB — COVID-19, FLU A+B NAA
Influenza A, NAA: NOT DETECTED
Influenza B, NAA: NOT DETECTED
SARS-CoV-2, NAA: NOT DETECTED

## 2020-06-08 ENCOUNTER — Emergency Department (INDEPENDENT_AMBULATORY_CARE_PROVIDER_SITE_OTHER)
Admission: EM | Admit: 2020-06-08 | Discharge: 2020-06-08 | Disposition: A | Discharge status: Home / Self Care | Payer: Managed Care, Other (non HMO) | Source: Home / Self Care | Attending: Family Medicine | Admitting: Family Medicine

## 2020-06-08 ENCOUNTER — Other Ambulatory Visit: Payer: Self-pay

## 2020-06-08 DIAGNOSIS — R059 Cough, unspecified: Secondary | ICD-10-CM

## 2020-06-08 MED ORDER — ALBUTEROL SULFATE HFA 108 (90 BASE) MCG/ACT IN AERS: 2.0000 | INHALATION_SPRAY | RESPIRATORY_TRACT | 0 refills | Status: AC | PRN

## 2020-06-08 MED ORDER — BENZONATATE 200 MG PO CAPS: 200.0000 mg | ORAL_CAPSULE | Freq: Three times a day (TID) | ORAL | 1 refills | Status: DC

## 2020-06-08 MED ORDER — PREDNISONE 20 MG PO TABS: ORAL_TABLET | ORAL | 0 refills | Status: DC

## 2020-06-08 NOTE — ED Triage Notes (Signed)
Pt was seen a few weeks ago for coughing and sob. Pt states that she took all of her medication and then everything came back. Pt is vaccinated.

## 2020-06-08 NOTE — ED Provider Notes (Signed)
[facility]    CSN: [id number] Arrival date & time: 06/08/20  1125      History   Chief Complaint Chief Complaint  Patient presents with  . Cough    HPI Lori Barry is a 40 y.o. female.   HPI   Patient states that she had COVID-19 a year ago.  Ever since then she has been plagued with spells of coughing and shortness of breath.  She has been seen here twice in the last 2 weeks for coughing.  She states that the Tessalon helps but not completely.  She was given prednisone, and states that her symptoms were much better on the prednisone but came back as soon as she went off of prednisone.  She has coughing.  Shortness of breath.  She is using her inhaler.  She is vaccinated.  No headache, body aches, fever or chills to indicate active infection.  Patient has a long history of seizure disorder.  Well-controlled on medication. Married with children.  No one at home is sick.  She has been working from home  Past Medical History:  Diagnosis Date  . COVID-19   . Epilepsy [facility])     Patient Active Problem List   Diagnosis Date Noted  . Epilepsy (HCC) 01/11/2018    Past Surgical History:  Procedure Laterality Date  . SHOULDER ARTHROSCOPY      OB History   No obstetric history on file.      Home Medications    Prior to Admission medications   Medication Sig Start Date End Date Taking? Authorizing Provider  buPROPion (WELLBUTRIN) 100 MG tablet Take 100 mg by mouth 2 (two) times daily.   Yes [provider]  Levonorgestrel-Ethinyl Estradiol (AMETHIA) 0.15-0.03 &0.01 MG tablet Take 1 tablet by mouth daily. 01/15/20  Yes [provider]  predniSONE (DELTASONE) 20 MG tablet Take 1 pill 2 times a day for 5 days, then take 1 a day until gone 06/08/20  Yes Eustace Moore, MD  promethazine (PHENERGAN) 25 MG suppository Place 1 suppository rectally every 6 hrs as needed for headache/nausea 04/22/20  Yes [provider]  Ubrogepant 100 MG  TABS Take one at onset of severe headache; may repeat once in 2 hours if headache persists; max of 2 in 24 hours 05/07/20  Yes [provider]  zonisamide (ZONEGRAN) 100 MG capsule Take by mouth. 04/13/19  Yes [provider]  albuterol (VENTOLIN HFA) 108 (90 Base) MCG/ACT inhaler Inhale 2 puffs into the lungs every 4 (four) hours as needed for wheezing or shortness of breath. 06/08/20   Eustace Moore, MD  benzonatate (TESSALON) 200 MG capsule Take 1 capsule (200 mg total) by mouth every 8 (eight) hours. 06/08/20   Eustace Moore, MD    Family History Family History  Problem Relation Age of Onset  . Asthma Mother   . Hypertension Father     Social History Social History   Tobacco Use  . Smoking status: Never Smoker  . Smokeless tobacco: Never Used  Vaping Use  . Vaping Use: Never used  Substance Use Topics  . Alcohol use: Yes  . Drug use: Not Currently     Allergies   Septra [sulfamethoxazole-trimethoprim]   Review of Systems Review of Systems See HPI  Physical Exam Triage Vital Signs ED Triage Vitals  Enc Vitals Group     BP 06/08/20 1137 118/82     Pulse Rate 06/08/20 1137 91     Resp --  Temp 06/08/20 1137 98.6 F (37 C)     Temp Source 06/08/20 1137 Oral     SpO2 06/08/20 1137 98 %     Weight 06/08/20 1135 130 lb (59 kg)     Height 06/08/20 1135 5\' 1"  (1.549 m)     Head Circumference --      Peak Flow --      Pain Score 06/08/20 1135 0     Pain Loc --      Pain Edu? --      Excl. in GC? --    No data found.  Updated Vital Signs BP 118/82 (BP Location: Left Arm)   Pulse 91   Temp 98.6 F (37 C) (Oral)   Ht 5\' 1"  (1.549 m)   Wt 59 kg   SpO2 98%   BMI 24.56 kg/m       Physical Exam Constitutional:      General: She is not in acute distress.    Appearance: She is well-developed and normal weight.  HENT:     Head: Normocephalic and atraumatic.     Right Ear: Tympanic membrane and ear canal normal.     Left Ear:  Tympanic membrane and ear canal normal.     Nose: Nose normal.     Mouth/Throat:     Mouth: Mucous membranes are moist.     Pharynx: Posterior oropharyngeal erythema present.  Eyes:     Conjunctiva/sclera: Conjunctivae normal.     Pupils: Pupils are equal, round, and reactive to light.  Cardiovascular:     Rate and Rhythm: Normal rate and regular rhythm.     Heart sounds: Normal heart sounds.  Pulmonary:     Effort: Pulmonary effort is normal. No respiratory distress.     Breath sounds: Normal breath sounds.     Comments: Lungs are clear Abdominal:     General: There is no distension.     Palpations: Abdomen is soft.  Musculoskeletal:        General: Normal range of motion.     Cervical back: Normal range of motion and neck supple.  Skin:    General: Skin is warm and dry.  Neurological:     Mental Status: She is alert.  Psychiatric:        Behavior: Behavior normal.      UC Treatments / Results  Labs (all labs ordered are listed, but only abnormal results are displayed) Labs Reviewed - No data to display  EKG   Radiology No results found.  Procedures Procedures (including critical care time)  Medications Ordered in UC Medications - No data to display  Initial Impression / Assessment and Plan / UC Course  I have reviewed the triage vital signs and the nursing notes.  Pertinent labs & imaging results that were available during my care of the patient were reviewed by me and considered in my medical decision making (see chart for details).     I reviewed chest x-ray from 05/23/2020.  It was normal.  Discussed with patient that after viral bronchitis some people can cough for 4 to 6 weeks.  Since she improved on the steroids I will refill these for her.  If she continues to have problems she should talk to her doctor about a pulmonary consult or consider evaluation at the post Covid care center Final Clinical Impressions(s) / UC Diagnoses   Final diagnoses:  Cough      Discharge Instructions     Post Covid care center= (  [phone]  Take Tessalon 200 mg 2 times a day In addition take a long-acting DM like Delsym or Mucinex DM Take prednisone as scheduled  Consider talking to your primary care doctor about pulmonary medicine follow-up   ED Prescriptions    Medication Sig Dispense Auth. Provider   albuterol (VENTOLIN HFA) 108 (90 Base) MCG/ACT inhaler Inhale 2 puffs into the lungs every 4 (four) hours as needed for wheezing or shortness of breath. 18 g Eustace Moore, MD   benzonatate (TESSALON) 200 MG capsule Take 1 capsule (200 mg total) by mouth every 8 (eight) hours. 30 capsule Eustace Moore, MD   predniSONE (DELTASONE) 20 MG tablet Take 1 pill 2 times a day for 5 days, then take 1 a day until gone 15 tablet Delton See Letta Pate, MD     PDMP not reviewed this encounter.   Eustace Moore, MD 06/08/20 1351

## 2020-06-08 NOTE — Discharge Instructions (Signed)
Post Covid care center= [phone]) [phone]  Take Tessalon 200 mg 2 times a day In addition take a long-acting DM like Delsym or Mucinex DM Take prednisone as scheduled  Consider talking to your primary care doctor about pulmonary medicine follow-up

## 2021-02-22 ENCOUNTER — Emergency Department (INDEPENDENT_AMBULATORY_CARE_PROVIDER_SITE_OTHER)
Admission: EM | Admit: 2021-02-22 | Discharge: 2021-02-22 | Disposition: A | Discharge status: Home / Self Care | Payer: Managed Care, Other (non HMO) | Source: Home / Self Care | Attending: Family Medicine | Admitting: Family Medicine

## 2021-02-22 ENCOUNTER — Other Ambulatory Visit: Payer: Self-pay

## 2021-02-22 ENCOUNTER — Encounter: Payer: Self-pay | Admitting: Emergency Medicine

## 2021-02-22 DIAGNOSIS — J101 Influenza due to other identified influenza virus with other respiratory manifestations: Secondary | ICD-10-CM

## 2021-02-22 LAB — POCT INFLUENZA A/B
Influenza A, POC: NEGATIVE
Influenza B, POC: POSITIVE — AB

## 2021-02-22 NOTE — Discharge Instructions (Signed)
You have influenza B Home to rest with lots of fluids May take over-the-counter cough and cold medicines Ibuprofen or Aleve may help with the chest wall pain Albuterol may help with the shortness of breath and coughing spells Call if not improving by Wednesday

## 2021-02-22 NOTE — ED Triage Notes (Signed)
Cough & fever since Wed  Tmax at home was 101 during the night  Ibuprofen 400mg  at 0830 COVID 2 years ago  Recent dx of asthma Was told to wait until December for a flu vaccine  Pt has passed out w/ vaccines since getting COVID  COVID vaccine x 2  Hx of epilepsy

## 2021-02-25 NOTE — ED Provider Notes (Signed)
[facility]    CSN: [id number] Arrival date & time: 02/22/21  1049      History   Chief Complaint Chief Complaint  Patient presents with   Cough   Fever    HPI Lori Barry is a 40 y.o. female.   HPI  Cough and fever for the last couple of days.  Her temperature was 101 during the night.  She has been taking ibuprofen for pain and fever.  No known exposure to illness.  She does have body aches.  Mild sore throat.  Headache.  Extreme fatigue  Past Medical History:  Diagnosis Date   COVID-19    Epilepsy [facility])     Patient Active Problem List   Diagnosis Date Noted   Epilepsy (HCC) 01/11/2018    Past Surgical History:  Procedure Laterality Date   SHOULDER ARTHROSCOPY      OB History   No obstetric history on file.      Home Medications    Prior to Admission medications   Medication Sig Start Date End Date Taking? Authorizing Provider  cetirizine (ZYRTEC) 10 MG tablet Take by mouth. 02/28/18  Yes [provider]  albuterol (VENTOLIN HFA) 108 (90 Base) MCG/ACT inhaler Inhale 2 puffs into the lungs every 4 (four) hours as needed for wheezing or shortness of breath. 06/08/20   Eustace Moore, MD  Levonorgestrel-Ethinyl Estradiol (AMETHIA) 0.15-0.03 &0.01 MG tablet Take 1 tablet by mouth daily. 01/15/20   [provider]  Multiple Vitamin (MULTI-VITAMINS) TABS Take by mouth.    [provider]  sertraline (ZOLOFT) 25 MG tablet Take 25 mg by mouth daily. 02/17/21   [provider]  Dwyane Luo 100-62.5-25 MCG/ACT AEPB Take 1 puff by mouth daily. 01/26/21   [provider]  zonisamide (ZONEGRAN) 100 MG capsule Take by mouth. 04/13/19   [provider]    Family History Family History  Problem Relation Age of Onset   Asthma Mother    Hypertension Father     Social History Social History   Tobacco Use   Smoking status: Never   Smokeless tobacco: Never  Vaping Use   Vaping Use: Never  used  Substance Use Topics   Alcohol use: Not Currently   Drug use: Not Currently     Allergies   Septra [sulfamethoxazole-trimethoprim]   Review of Systems Review of Systems See HPI  Physical Exam Triage Vital Signs ED Triage Vitals  Enc Vitals Group     BP 02/22/21 1200 (!) 138/93     Pulse Rate 02/22/21 1200 85     Resp 02/22/21 1200 16     Temp 02/22/21 1200 97.9 F (36.6 C)     Temp Source 02/22/21 1200 Oral     SpO2 02/22/21 1200 100 %     Weight 02/22/21 1204 139 lb (63 kg)     Height 02/22/21 1204 5\' 1"  (1.549 m)     Head Circumference --      Peak Flow --      Pain Score 02/22/21 1203 4     Pain Loc --      Pain Edu? --      Excl. in GC? --    No data found.  Updated Vital Signs BP (!) 138/93 (BP Location: Right Arm)   Pulse 85   Temp 97.9 F (36.6 C) (Oral)   Resp 16   Ht 5\' 1"  (1.549 m)   Wt 63 kg   LMP  (Approximate) Comment: 2  months ago - gets one every 3 months  SpO2 100%   BMI 26.26 kg/m :     Physical Exam Constitutional:      General: She is not in acute distress.    Appearance: She is well-developed and normal weight. She is ill-appearing.  HENT:     Head: Normocephalic and atraumatic.     Right Ear: Tympanic membrane and ear canal normal.     Left Ear: Tympanic membrane and external ear normal.     Nose: Congestion and rhinorrhea present.     Mouth/Throat:     Pharynx: Posterior oropharyngeal erythema present.  Eyes:     Conjunctiva/sclera: Conjunctivae normal.     Pupils: Pupils are equal, round, and reactive to light.  Cardiovascular:     Rate and Rhythm: Normal rate and regular rhythm.     Heart sounds: Normal heart sounds.  Pulmonary:     Effort: Pulmonary effort is normal. No respiratory distress.     Breath sounds: Normal breath sounds.  Abdominal:     General: There is no distension.     Palpations: Abdomen is soft.  Musculoskeletal:        General: Normal range of motion.     Cervical back: Normal range of motion.   Lymphadenopathy:     Cervical: Cervical adenopathy present.  Skin:    General: Skin is warm and dry.  Neurological:     Mental Status: She is alert.     UC Treatments / Results  Labs (all labs ordered are listed, but only abnormal results are displayed) Labs Reviewed  POCT INFLUENZA A/B - Abnormal; Notable for the following components:      Result Value   Influenza B, POC Positive (*)    All other components within normal limits    EKG   Radiology No results found.  Procedures Procedures (including critical care time)  Medications Ordered in UC Medications - No data to display  Initial Impression / Assessment and Plan / UC Course  I have reviewed the triage vital signs and the nursing notes.  Pertinent labs & imaging results that were available during my care of the patient were reviewed by me and considered in my medical decision making (see chart for details).     Discussed flu treatment at home. Final Clinical Impressions(s) / UC Diagnoses   Final diagnoses:  Influenza B     Discharge Instructions      You have influenza B Home to rest with lots of fluids May take over-the-counter cough and cold medicines Ibuprofen or Aleve may help with the chest wall pain Albuterol may help with the shortness of breath and coughing spells Call if not improving by Wednesday   ED Prescriptions   None    PDMP not reviewed this encounter.   Eustace Moore, MD 02/25/21 [phone]

## 2021-02-28 IMAGING — DX DG CHEST 2V
2 series · 2 of 2 positions shown · non-contrast
Comparison: 09/14/2019 chest radiograph.

CLINICAL DATA: Productive cough and dyspnea

EXAM:
CHEST - 2 VIEW

[chest pa]
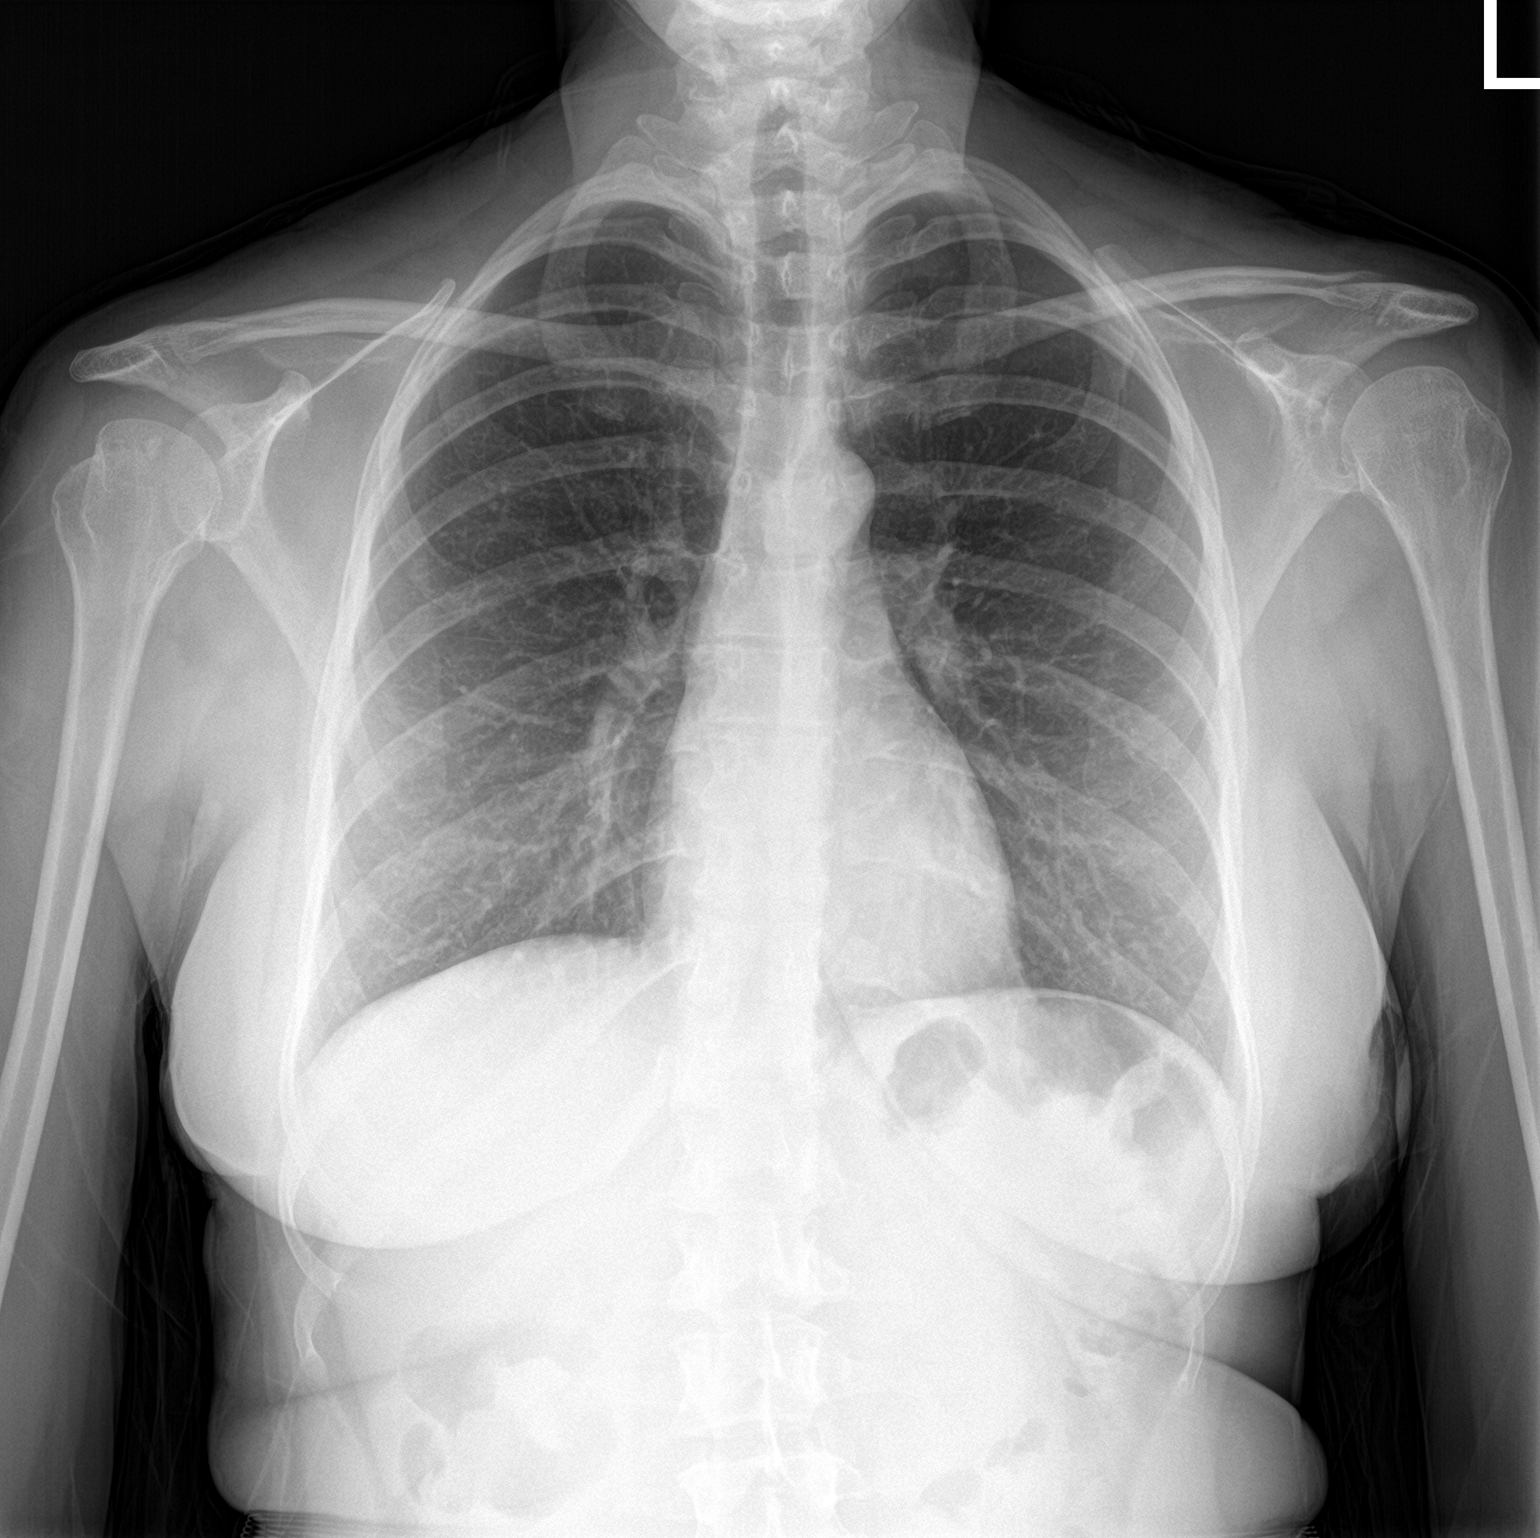

[chest lat]
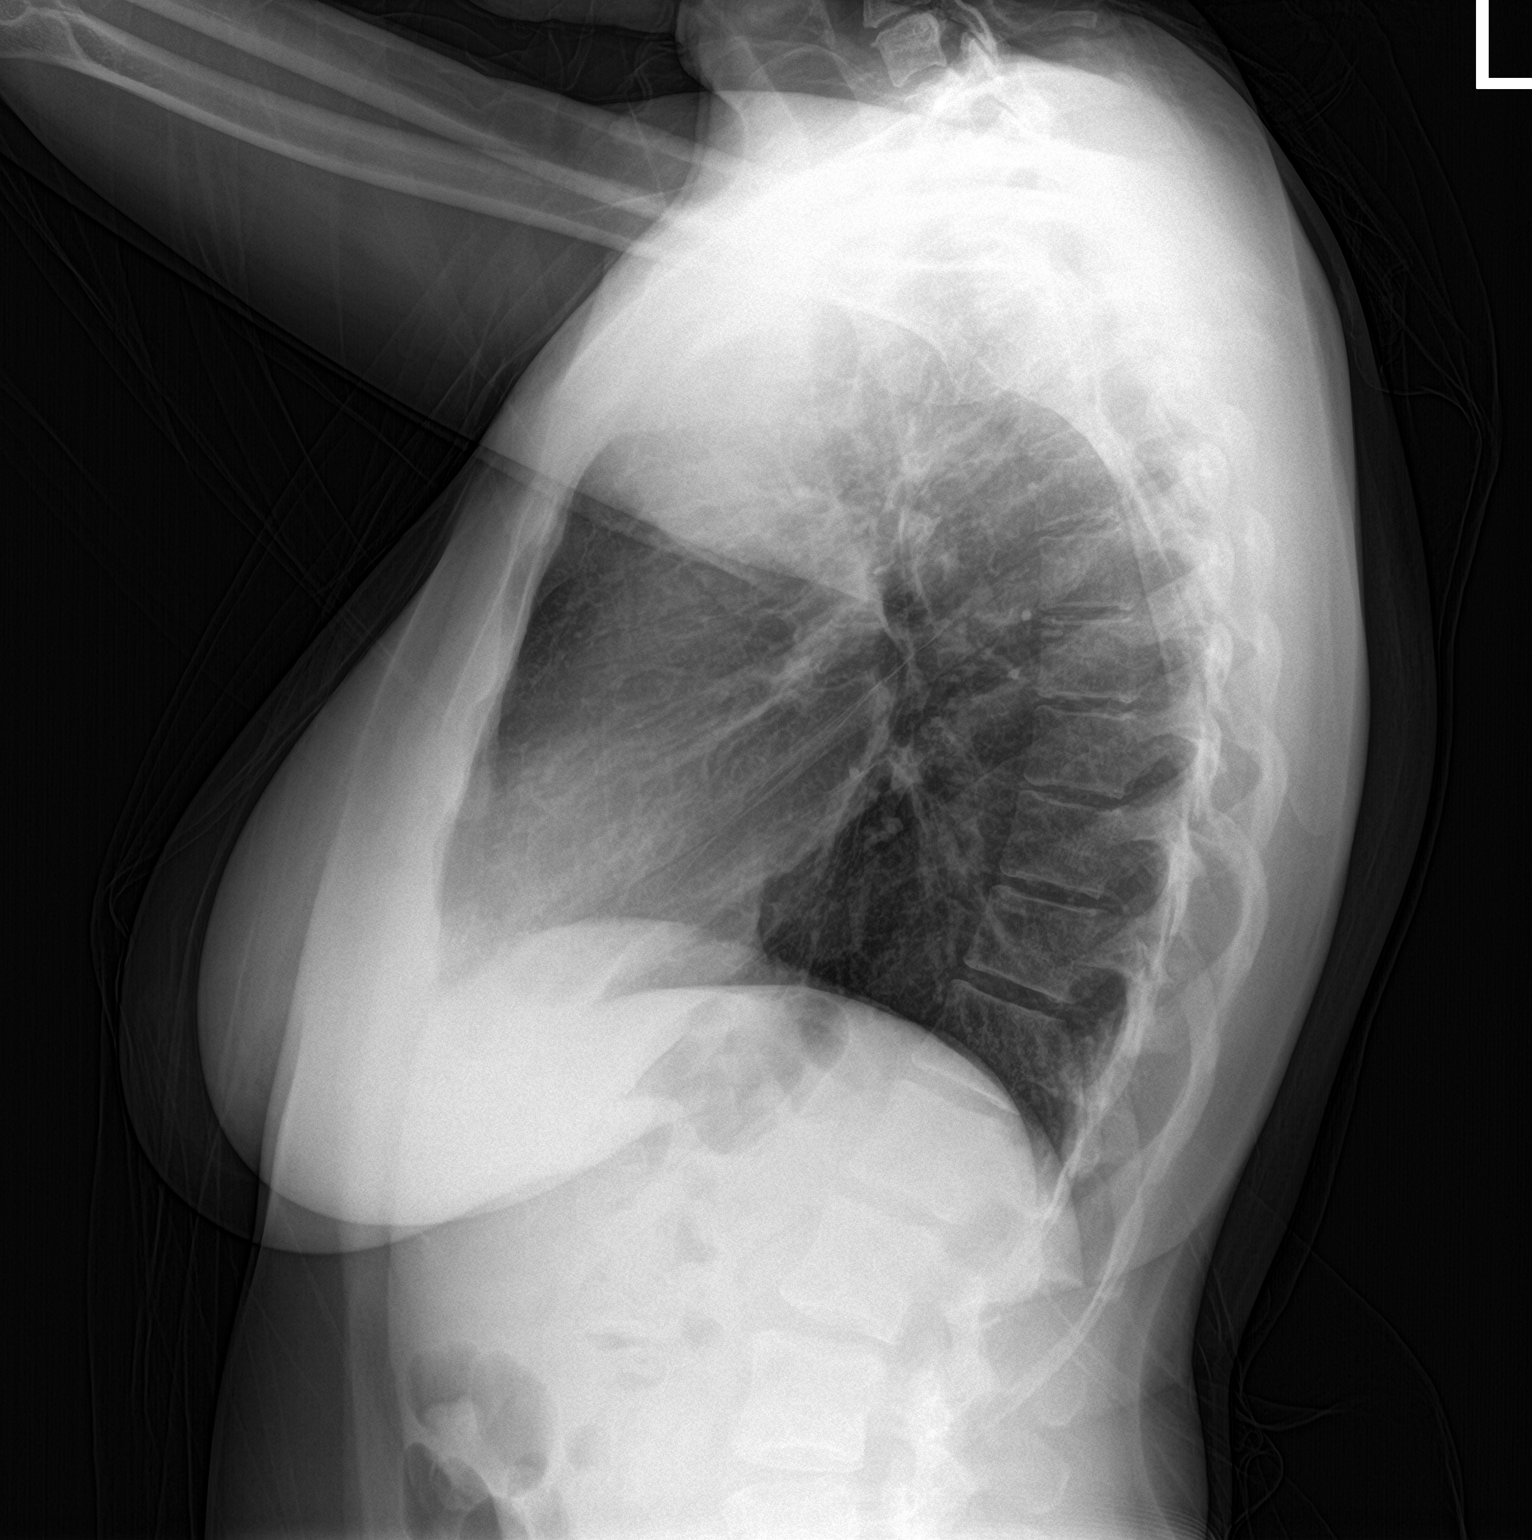

[2 of 2 positions shown; findings below may reference images not displayed]

FINDINGS: Stable cardiomediastinal silhouette with normal heart size. No
pneumothorax. No pleural effusion. Lungs appear clear, with no acute
consolidative airspace disease and no pulmonary edema.
IMPRESSION: No active cardiopulmonary disease.
